# Patient Record
Sex: Female | Born: 1965 | Race: White | Hispanic: No | Marital: Married | Smoking: Never smoker
Health system: Southern US, Community
[De-identification: ages and names within clinical notes are randomized; demographics above are authoritative.]

## PROBLEM LIST (undated history)

## (undated) DIAGNOSIS — I10 Essential (primary) hypertension: Secondary | ICD-10-CM

## (undated) DIAGNOSIS — Z5189 Encounter for other specified aftercare: Secondary | ICD-10-CM

## (undated) DIAGNOSIS — R51 Headache: Secondary | ICD-10-CM

## (undated) DIAGNOSIS — K219 Gastro-esophageal reflux disease without esophagitis: Secondary | ICD-10-CM

## (undated) HISTORY — PX: OTHER SURGICAL HISTORY: SHX169

## (undated) HISTORY — PX: WISDOM TOOTH EXTRACTION: SHX21

## (undated) HISTORY — DX: Essential (primary) hypertension: I10

## (undated) HISTORY — PX: HERNIA REPAIR: SHX51

---

## 1989-05-30 DIAGNOSIS — IMO0001 Reserved for inherently not codable concepts without codable children: Secondary | ICD-10-CM

## 1989-05-30 DIAGNOSIS — Z5189 Encounter for other specified aftercare: Secondary | ICD-10-CM

## 1989-05-30 HISTORY — DX: Reserved for inherently not codable concepts without codable children: IMO0001

## 1989-05-30 HISTORY — DX: Encounter for other specified aftercare: Z51.89

## 1989-09-29 HISTORY — PX: CHOLECYSTECTOMY: SHX55

## 1992-09-29 HISTORY — PX: TUBAL LIGATION: SHX77

## 1997-09-29 HISTORY — PX: ABDOMINAL HYSTERECTOMY: SHX81

## 2003-03-13 ENCOUNTER — Encounter: Payer: Self-pay | Admitting: Emergency Medicine

## 2003-03-13 ENCOUNTER — Emergency Department (HOSPITAL_COMMUNITY): Admission: EM | Admit: 2003-03-13 | Discharge: 2003-03-13 | Payer: Self-pay | Admitting: Emergency Medicine

## 2003-03-16 ENCOUNTER — Encounter: Admission: RE | Admit: 2003-03-16 | Discharge: 2003-03-16 | Payer: Self-pay | Admitting: Family Medicine

## 2003-03-16 ENCOUNTER — Encounter: Payer: Self-pay | Admitting: Family Medicine

## 2006-12-05 ENCOUNTER — Emergency Department (HOSPITAL_COMMUNITY): Admission: EM | Admit: 2006-12-05 | Discharge: 2006-12-05 | Payer: Self-pay | Admitting: Emergency Medicine

## 2006-12-05 IMAGING — CR DG CERVICAL SPINE COMPLETE 4+V
4 series · 4 of 4 positions shown · non-contrast
Comparison: none

CLINICAL DATA: Fall from horse, neck pain.  
 CERVICAL SPINE ? 6 VIEW:

[w c-spine lat]
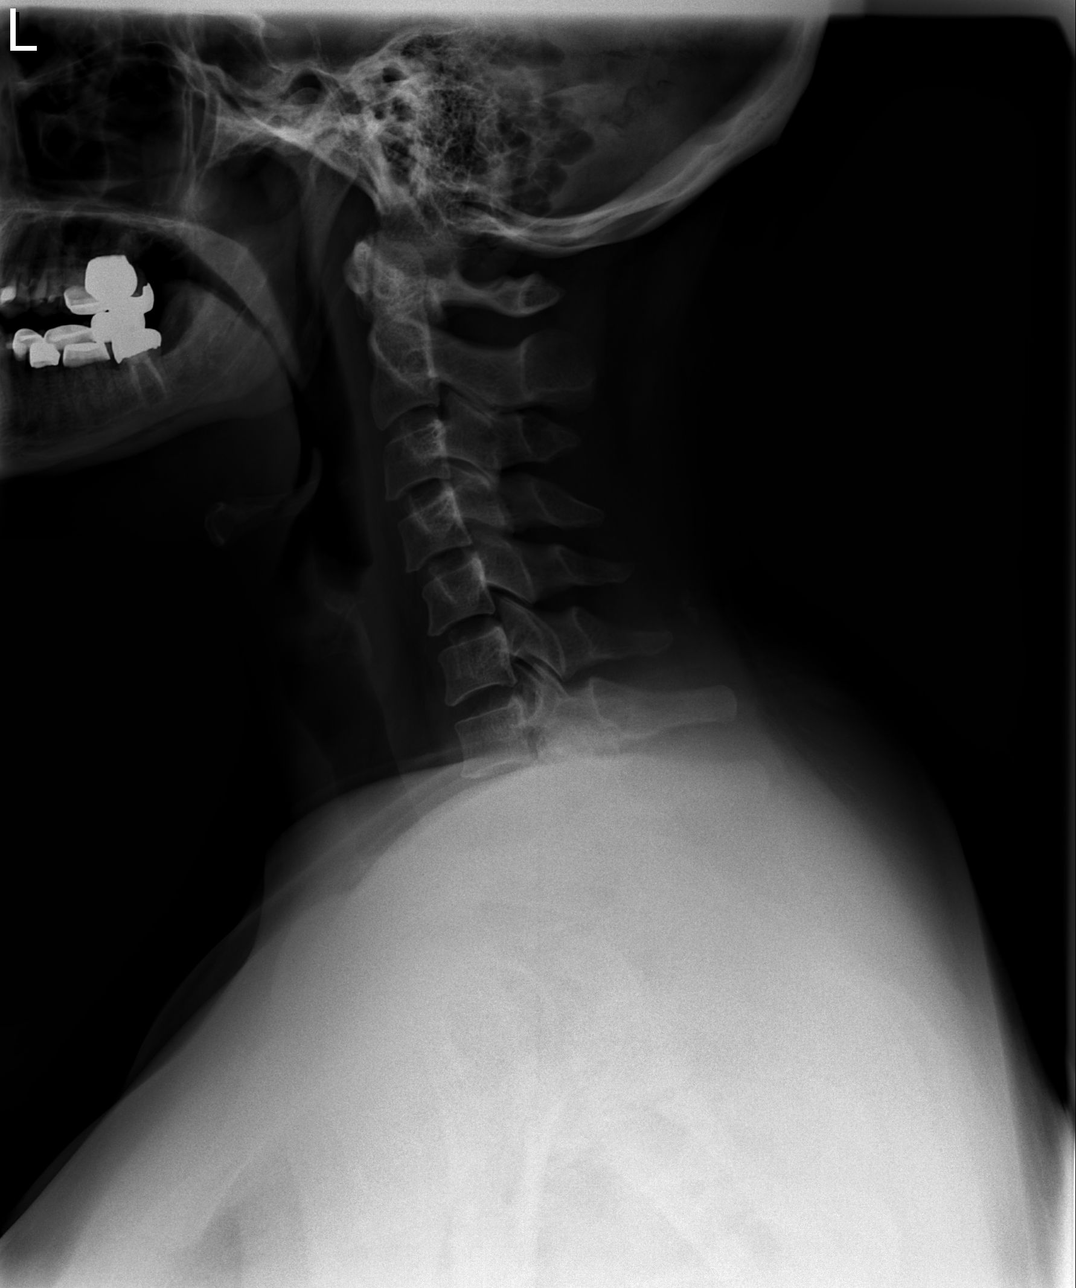

[w c-spine oblique (1 of 2)]
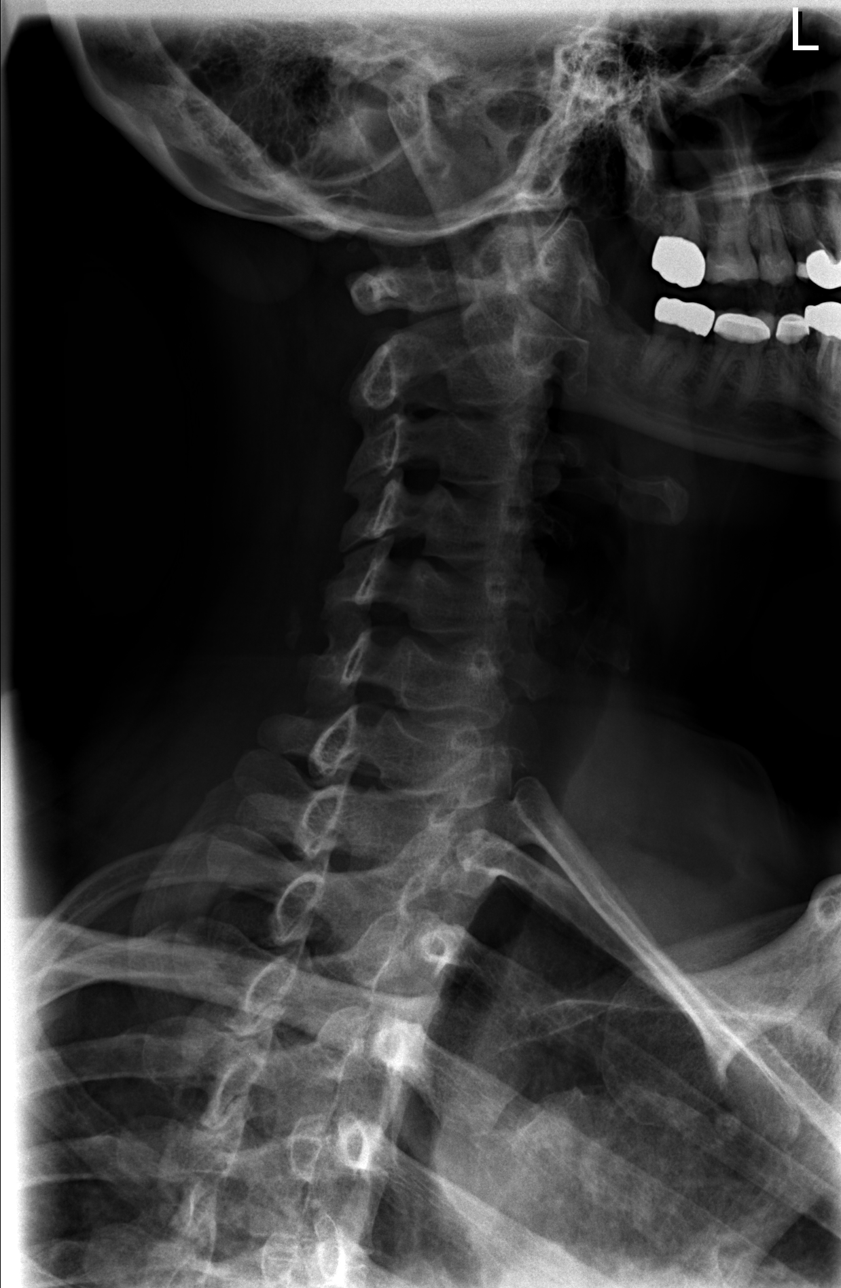

[w c-spine oblique (2 of 2)]
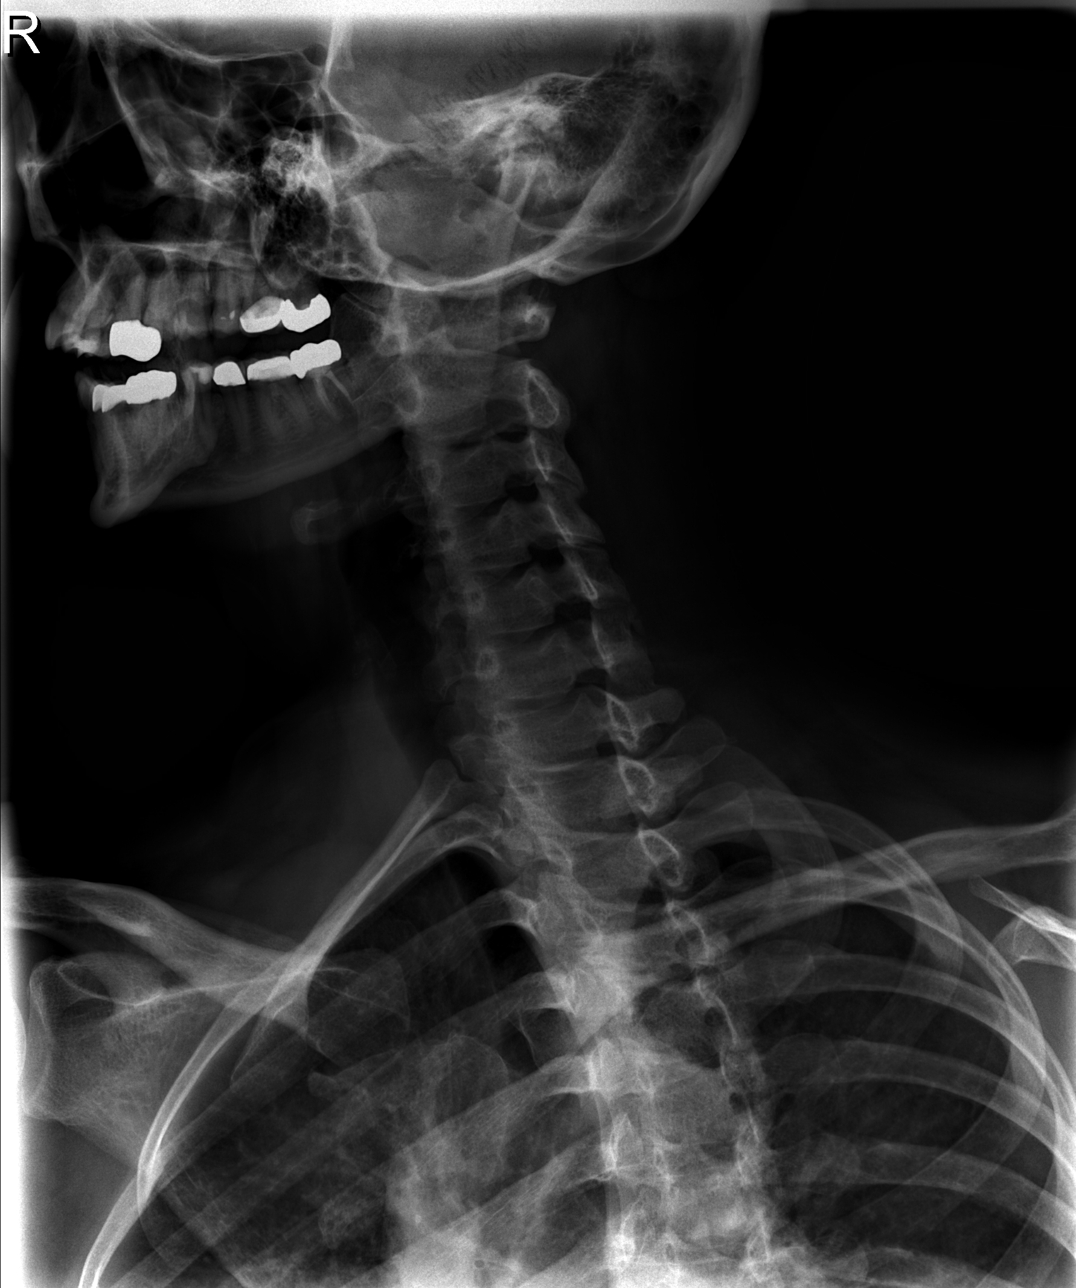

[w c-spine a.p.]
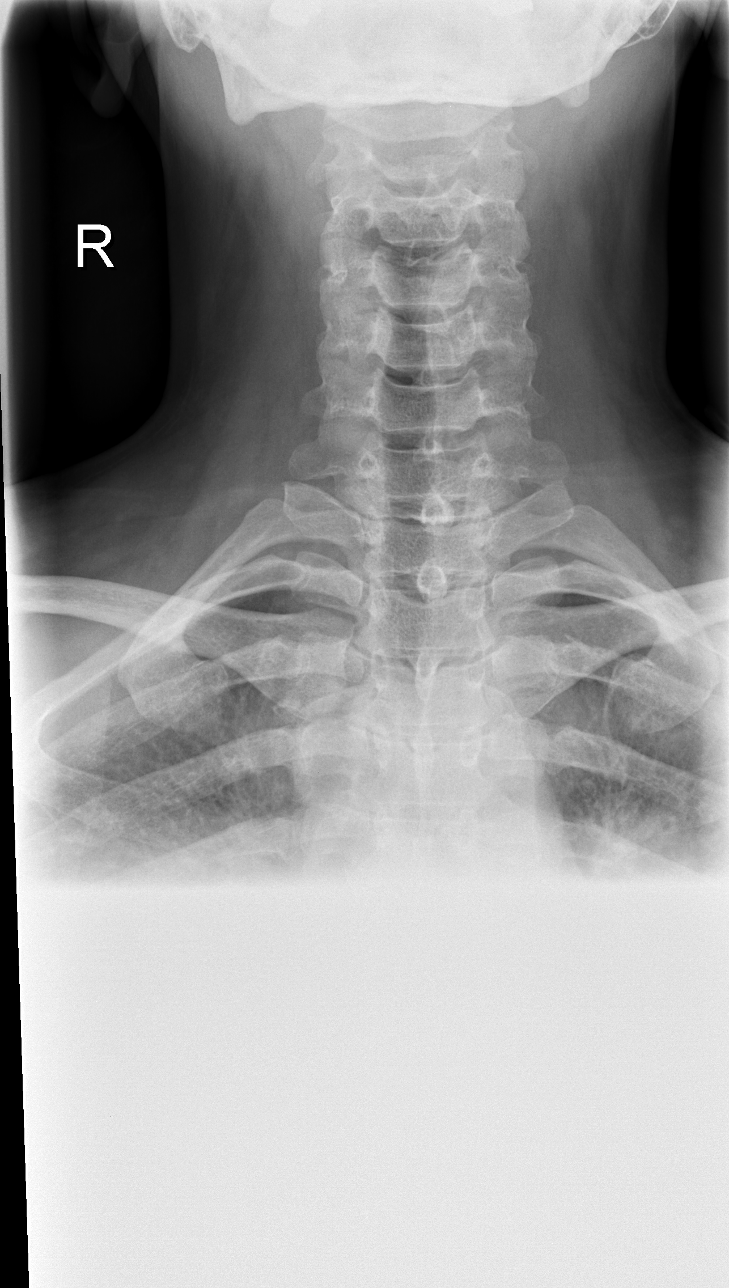

[4 of 4 positions shown; findings below may reference images not displayed]

FINDINGS: C1 through the cervicothoracic junction is visualized.  There is reversal of the normal cervical lordosis at C4-C5.  No precervical soft tissue widening.  Neural foramina are patent.  Calcifications/ossification is noted over the posterior soft tissues between the C5 and C6 spinous processes but no donor site is seen to suggest avulsion fracture and the spinous processes are intact.  The odontoid is unremarkable.
IMPRESSION: Reversal of the normal cervical lordosis may be positional.  No acute fracture or dislocation.

## 2010-09-29 HISTORY — PX: OTHER SURGICAL HISTORY: SHX169

## 2011-08-18 ENCOUNTER — Other Ambulatory Visit: Payer: Self-pay | Admitting: Family Medicine

## 2011-08-18 DIAGNOSIS — R1031 Right lower quadrant pain: Secondary | ICD-10-CM

## 2011-08-20 ENCOUNTER — Ambulatory Visit
Admission: RE | Admit: 2011-08-20 | Discharge: 2011-08-20 | Disposition: A | Discharge status: Home / Self Care | Payer: BC Managed Care – HMO | Source: Ambulatory Visit | Attending: Family Medicine | Admitting: Family Medicine

## 2011-08-20 DIAGNOSIS — R1031 Right lower quadrant pain: Secondary | ICD-10-CM

## 2011-08-20 MED ORDER — IOHEXOL 300 MG/ML  SOLN
100.0000 mL | Freq: Once | INTRAMUSCULAR | Status: AC | PRN
  Administered 2011-08-20: 100 mL via INTRAVENOUS

## 2011-11-10 ENCOUNTER — Encounter (HOSPITAL_COMMUNITY): Payer: Self-pay

## 2011-11-17 ENCOUNTER — Encounter (HOSPITAL_COMMUNITY): Payer: Self-pay

## 2011-11-17 ENCOUNTER — Encounter (HOSPITAL_COMMUNITY)
Admission: RE | Admit: 2011-11-17 | Discharge: 2011-11-17 | Disposition: A | Discharge status: Home / Self Care | Payer: BC Managed Care – PPO | Source: Ambulatory Visit | Attending: Obstetrics and Gynecology | Admitting: Obstetrics and Gynecology

## 2011-11-17 HISTORY — DX: Gastro-esophageal reflux disease without esophagitis: K21.9

## 2011-11-17 HISTORY — DX: Encounter for other specified aftercare: Z51.89

## 2011-11-17 HISTORY — DX: Headache: R51

## 2011-11-17 LAB — CBC
HCT: 42.1 % (ref 36.0–46.0)
MCV: 82.5 fL (ref 78.0–100.0)
Platelets: 298 10*3/uL (ref 150–400)
RBC: 5.1 MIL/uL (ref 3.87–5.11)
RDW: 14.3 % (ref 11.5–15.5)
WBC: 8.3 10*3/uL (ref 4.0–10.5)

## 2011-11-17 LAB — SURGICAL PCR SCREEN: Staphylococcus aureus: POSITIVE — AB

## 2011-11-17 NOTE — Pre-Procedure Instructions (Signed)
Ok to see patient DOS. 

## 2011-11-17 NOTE — Patient Instructions (Addendum)
   Your procedure is scheduled on: Tuesday, Feb 26th  Enter through the Main Entrance of [facility] at: [employer] up the phone at the desk and dial [phone] and inform us of your arrival.  Please call this number if you have any problems the morning of surgery: [phone]  Remember: Do not eat food after midnight: Monday Do not drink clear liquids after: Monday Take these medicines the morning of surgery with a SIP OF WATER: DEXILANT  Do not wear jewelry, make-up, or FINGER nail polish Do not wear lotions, powders, perfumes or deodorant. Do not shave 48 hours prior to surgery. Do not bring valuables to the hospital.  Leave suitcase in the car. After Surgery it may be brought to your room. For patients being admitted to the hospital, checkout time is 11:00am the day of discharge.  Home with Husband Tammy Bowman  cell [phone]  Patients discharged on the day of surgery will not be allowed to drive home.     Remember to use your hibiclens as instructed.Please shower with 1/2 bottle the evening before your surgery and the other 1/2 bottle the morning of surgery.

## 2011-11-24 MED ORDER — CEFAZOLIN SODIUM-DEXTROSE 2-3 GM-% IV SOLR
2.0000 g | INTRAVENOUS | Status: AC
  Administered 2011-11-25: 2 g via INTRAVENOUS
  Filled 2011-11-24: qty 50

## 2011-11-24 NOTE — H&P (Signed)
46 year old G 3 P3 who has had a hysterectomy presents for Laparotomy, possible BSO. She has had an abdominoplasty and has known mesh placed at her umbilicus. She is having chronic pelvic pain. Progesterone did not help her pain. Ultrasound on 10/27/2011 showed 2 right ovarian simples cysts and multiple calcifications. Possible endometriosis. Hydrosalpinx on the right. Left ovary showed a cyst 2.8 x 2.4 x 2.2 cm with two smaller cysts.  Medical History : Headaches  IBS UTI  Surgical history: Above Lap Chole CSection Hysterectomy 1999 Abdominoplasty 2007  Meds. None  Allergies Latex swellinig  Sox history  No alcohol  No tobacco Married  Family History is negative  ROS is non contributory  Afebrile Vital signs stable General alert and oriented Lung CTAB Car RRR Abdomen multiple surgical scars  Mesh palpable at umbilicus Pelvic  Cervix is absent  Impression: Persistent ovarian cysts Possible adhesions, endometriosis  PLAN: Laparotomy Possible lysis of adhesions BSO Risks reviewed with the patient

## 2011-11-25 ENCOUNTER — Encounter (HOSPITAL_COMMUNITY): Payer: Self-pay | Admitting: Anesthesiology

## 2011-11-25 ENCOUNTER — Inpatient Hospital Stay (HOSPITAL_COMMUNITY): Payer: BC Managed Care – PPO | Admitting: Anesthesiology

## 2011-11-25 ENCOUNTER — Encounter (HOSPITAL_COMMUNITY): Payer: Self-pay | Admitting: *Deleted

## 2011-11-25 ENCOUNTER — Inpatient Hospital Stay (HOSPITAL_COMMUNITY)
Admission: RE | Admit: 2011-11-25 | Discharge: 2011-11-27 | DRG: 359 | Disposition: A | Discharge status: Home / Self Care | Payer: BC Managed Care – PPO | Source: Ambulatory Visit | Attending: Obstetrics and Gynecology | Admitting: Obstetrics and Gynecology

## 2011-11-25 ENCOUNTER — Encounter (HOSPITAL_COMMUNITY)
Admission: RE | Disposition: A | Discharge status: Home / Self Care | Payer: Self-pay | Source: Ambulatory Visit | Attending: Obstetrics and Gynecology

## 2011-11-25 DIAGNOSIS — Z01812 Encounter for preprocedural laboratory examination: Secondary | ICD-10-CM

## 2011-11-25 DIAGNOSIS — N949 Unspecified condition associated with female genital organs and menstrual cycle: Secondary | ICD-10-CM | POA: Diagnosis present

## 2011-11-25 DIAGNOSIS — Z01818 Encounter for other preprocedural examination: Secondary | ICD-10-CM

## 2011-11-25 DIAGNOSIS — N83209 Unspecified ovarian cyst, unspecified side: Principal | ICD-10-CM | POA: Diagnosis present

## 2011-11-25 DIAGNOSIS — Z90710 Acquired absence of both cervix and uterus: Secondary | ICD-10-CM

## 2011-11-25 DIAGNOSIS — Z90722 Acquired absence of ovaries, bilateral: Secondary | ICD-10-CM

## 2011-11-25 HISTORY — PX: SALPINGOOPHORECTOMY: SHX82

## 2011-11-25 HISTORY — PX: LAPAROTOMY: SHX154

## 2011-11-25 SURGERY — LAPAROTOMY, EXPLORATORY
Anesthesia: General | Site: Abdomen | Wound class: Clean Contaminated

## 2011-11-25 MED ORDER — KETOROLAC TROMETHAMINE 30 MG/ML IJ SOLN: 30.0000 mg | Freq: Once | INTRAMUSCULAR | Status: DC

## 2011-11-25 MED ORDER — ONDANSETRON HCL 4 MG/2ML IJ SOLN
INTRAMUSCULAR | Status: AC
  Filled 2011-11-25: qty 2

## 2011-11-25 MED ORDER — PROPOFOL 10 MG/ML IV EMUL
INTRAVENOUS | Status: DC | PRN
  Administered 2011-11-25: 200 mg via INTRAVENOUS

## 2011-11-25 MED ORDER — NEOSTIGMINE METHYLSULFATE 1 MG/ML IJ SOLN
INTRAMUSCULAR | Status: AC
  Filled 2011-11-25: qty 10

## 2011-11-25 MED ORDER — DIPHENHYDRAMINE HCL 12.5 MG/5ML PO ELIX
12.5000 mg | ORAL_SOLUTION | Freq: Four times a day (QID) | ORAL | Status: DC | PRN
  Filled 2011-11-25: qty 5

## 2011-11-25 MED ORDER — PROMETHAZINE HCL 25 MG/ML IJ SOLN: 12.5000 mg | INTRAMUSCULAR | Status: DC | PRN

## 2011-11-25 MED ORDER — NEOSTIGMINE METHYLSULFATE 1 MG/ML IJ SOLN
INTRAMUSCULAR | Status: DC | PRN
  Administered 2011-11-25: 2 mg via INTRAVENOUS

## 2011-11-25 MED ORDER — BUPIVACAINE-EPINEPHRINE PF 0.25-1:200000 % IJ SOLN
INTRAMUSCULAR | Status: AC
  Filled 2011-11-25: qty 30

## 2011-11-25 MED ORDER — FENTANYL CITRATE 0.05 MG/ML IJ SOLN
INTRAMUSCULAR | Status: AC
  Filled 2011-11-25: qty 5

## 2011-11-25 MED ORDER — KETOROLAC TROMETHAMINE 30 MG/ML IJ SOLN: 15.0000 mg | Freq: Once | INTRAMUSCULAR | Status: DC | PRN

## 2011-11-25 MED ORDER — ONDANSETRON HCL 4 MG/2ML IJ SOLN: 4.0000 mg | Freq: Four times a day (QID) | INTRAMUSCULAR | Status: DC | PRN

## 2011-11-25 MED ORDER — TEMAZEPAM 15 MG PO CAPS: 15.0000 mg | ORAL_CAPSULE | Freq: Every evening | ORAL | Status: DC | PRN

## 2011-11-25 MED ORDER — PROPOFOL 10 MG/ML IV EMUL
INTRAVENOUS | Status: AC
  Filled 2011-11-25: qty 20

## 2011-11-25 MED ORDER — HYDROMORPHONE HCL PF 1 MG/ML IJ SOLN
0.2500 mg | INTRAMUSCULAR | Status: DC | PRN
  Administered 2011-11-25 (×4): 0.5 mg via INTRAVENOUS

## 2011-11-25 MED ORDER — HYDROMORPHONE 0.3 MG/ML IV SOLN
INTRAVENOUS | Status: DC
  Administered 2011-11-25: 10:00:00 via INTRAVENOUS
  Administered 2011-11-25: 1.79 mg via INTRAVENOUS
  Administered 2011-11-25: 0.2 mg via INTRAVENOUS
  Administered 2011-11-25: 10 mg via INTRAVENOUS
  Administered 2011-11-26: 0.6 mg via INTRAVENOUS
  Administered 2011-11-26: 1.19 mg via INTRAVENOUS

## 2011-11-25 MED ORDER — NALOXONE HCL 0.4 MG/ML IJ SOLN: 0.4000 mg | INTRAMUSCULAR | Status: DC | PRN

## 2011-11-25 MED ORDER — DIPHENHYDRAMINE HCL 50 MG/ML IJ SOLN
12.5000 mg | Freq: Four times a day (QID) | INTRAMUSCULAR | Status: DC | PRN
Start: 2011-11-25 — End: 2011-11-27

## 2011-11-25 MED ORDER — GLYCOPYRROLATE 0.2 MG/ML IJ SOLN
INTRAMUSCULAR | Status: DC | PRN
  Administered 2011-11-25: .4 mg via INTRAVENOUS

## 2011-11-25 MED ORDER — HYDROMORPHONE 0.3 MG/ML IV SOLN
INTRAVENOUS | Status: AC
  Filled 2011-11-25: qty 25

## 2011-11-25 MED ORDER — MIDAZOLAM HCL 2 MG/2ML IJ SOLN
INTRAMUSCULAR | Status: AC
  Filled 2011-11-25: qty 2

## 2011-11-25 MED ORDER — KETOROLAC TROMETHAMINE 30 MG/ML IJ SOLN
INTRAMUSCULAR | Status: AC
  Filled 2011-11-25: qty 1

## 2011-11-25 MED ORDER — TRAMADOL HCL 50 MG PO TABS: 50.0000 mg | ORAL_TABLET | Freq: Four times a day (QID) | ORAL | Status: DC | PRN

## 2011-11-25 MED ORDER — DEXAMETHASONE SODIUM PHOSPHATE 10 MG/ML IJ SOLN
INTRAMUSCULAR | Status: AC
  Filled 2011-11-25: qty 1

## 2011-11-25 MED ORDER — ROCURONIUM BROMIDE 50 MG/5ML IV SOLN
INTRAVENOUS | Status: AC
  Filled 2011-11-25: qty 1

## 2011-11-25 MED ORDER — ROCURONIUM BROMIDE 100 MG/10ML IV SOLN
INTRAVENOUS | Status: DC | PRN
  Administered 2011-11-25: 40 mg via INTRAVENOUS

## 2011-11-25 MED ORDER — LIDOCAINE HCL (CARDIAC) 20 MG/ML IV SOLN
INTRAVENOUS | Status: AC
  Filled 2011-11-25: qty 5

## 2011-11-25 MED ORDER — DEXAMETHASONE SODIUM PHOSPHATE 4 MG/ML IJ SOLN
INTRAMUSCULAR | Status: DC | PRN
  Administered 2011-11-25: 10 mg via INTRAVENOUS

## 2011-11-25 MED ORDER — CEFAZOLIN SODIUM 1-5 GM-% IV SOLN
INTRAVENOUS | Status: AC
  Filled 2011-11-25: qty 50

## 2011-11-25 MED ORDER — HYDROMORPHONE HCL PF 1 MG/ML IJ SOLN
INTRAMUSCULAR | Status: AC
  Administered 2011-11-25: 0.5 mg via INTRAVENOUS
  Filled 2011-11-25: qty 1

## 2011-11-25 MED ORDER — ONDANSETRON HCL 4 MG/2ML IJ SOLN
INTRAMUSCULAR | Status: DC | PRN
  Administered 2011-11-25: 4 mg via INTRAVENOUS

## 2011-11-25 MED ORDER — IBUPROFEN 600 MG PO TABS: 600.0000 mg | ORAL_TABLET | Freq: Four times a day (QID) | ORAL | Status: DC | PRN

## 2011-11-25 MED ORDER — MENTHOL 3 MG MT LOZG: 1.0000 | LOZENGE | OROMUCOSAL | Status: DC | PRN

## 2011-11-25 MED ORDER — MIDAZOLAM HCL 5 MG/5ML IJ SOLN
INTRAMUSCULAR | Status: DC | PRN
  Administered 2011-11-25: 2 mg via INTRAVENOUS

## 2011-11-25 MED ORDER — FENTANYL CITRATE 0.05 MG/ML IJ SOLN
INTRAMUSCULAR | Status: DC | PRN
  Administered 2011-11-25: 150 ug via INTRAVENOUS
  Administered 2011-11-25 (×2): 50 ug via INTRAVENOUS

## 2011-11-25 MED ORDER — GLYCOPYRROLATE 0.2 MG/ML IJ SOLN
INTRAMUSCULAR | Status: AC
  Filled 2011-11-25: qty 2

## 2011-11-25 MED ORDER — LACTATED RINGERS IV SOLN
INTRAVENOUS | Status: DC
  Administered 2011-11-25: 08:00:00 via INTRAVENOUS
  Administered 2011-11-25: 125 mL/h via INTRAVENOUS

## 2011-11-25 MED ORDER — BUPIVACAINE HCL (PF) 0.25 % IJ SOLN
INTRAMUSCULAR | Status: DC | PRN
  Administered 2011-11-25: 10 mL

## 2011-11-25 MED ORDER — KETOROLAC TROMETHAMINE 30 MG/ML IJ SOLN
INTRAMUSCULAR | Status: DC | PRN
  Administered 2011-11-25: 30 mg via INTRAVENOUS

## 2011-11-25 MED ORDER — LIDOCAINE HCL (CARDIAC) 20 MG/ML IV SOLN
INTRAVENOUS | Status: DC | PRN
  Administered 2011-11-25: 50 mg via INTRAVENOUS

## 2011-11-25 MED ORDER — SODIUM CHLORIDE 0.9 % IJ SOLN: 9.0000 mL | INTRAMUSCULAR | Status: DC | PRN

## 2011-11-25 SURGICAL SUPPLY — 32 items
BLADE SURG CLIPPER 3M 9600 (MISCELLANEOUS) ×3 IMPLANT
CANISTER SUCTION 2500CC (MISCELLANEOUS) ×3 IMPLANT
CHLORAPREP W/TINT 26ML (MISCELLANEOUS) ×3 IMPLANT
CLOTH BEACON ORANGE TIMEOUT ST (SAFETY) ×3 IMPLANT
COVER LIGHT HANDLE  1/PK (MISCELLANEOUS) ×3
COVER LIGHT HANDLE 1/PK (MISCELLANEOUS) ×6 IMPLANT
DECANTER SPIKE VIAL GLASS SM (MISCELLANEOUS) IMPLANT
DERMABOND ADVANCED (GAUZE/BANDAGES/DRESSINGS) ×1
DERMABOND ADVANCED .7 DNX12 (GAUZE/BANDAGES/DRESSINGS) ×2 IMPLANT
GAUZE SPONGE 4X4 16PLY XRAY LF (GAUZE/BANDAGES/DRESSINGS) ×3 IMPLANT
GLOVE BIO SURGEON STRL SZ 6.5 (GLOVE) ×6 IMPLANT
GOWN PREVENTION PLUS LG XLONG (DISPOSABLE) ×9 IMPLANT
NEEDLE HYPO 22GX1.5 SAFETY (NEEDLE) ×3 IMPLANT
NEEDLE HYPO 25X1 1.5 SAFETY (NEEDLE) IMPLANT
NS IRRIG 1000ML POUR BTL (IV SOLUTION) ×3 IMPLANT
PACK ABDOMINAL GYN (CUSTOM PROCEDURE TRAY) ×3 IMPLANT
PAD OB MATERNITY 4.3X12.25 (PERSONAL CARE ITEMS) ×3 IMPLANT
SPONGE LAP 18X18 X RAY DECT (DISPOSABLE) IMPLANT
STAPLER VISISTAT 35W (STAPLE) IMPLANT
SUT PDS AB 0 CTX 60 (SUTURE) IMPLANT
SUT PLAIN 2 0 XLH (SUTURE) IMPLANT
SUT VIC AB 0 CT1 18XCR BRD8 (SUTURE) ×4 IMPLANT
SUT VIC AB 0 CT1 27 (SUTURE) ×3
SUT VIC AB 0 CT1 27XBRD ANBCTR (SUTURE) ×6 IMPLANT
SUT VIC AB 0 CT1 8-18 (SUTURE) ×2
SUT VIC AB 4-0 KS 27 (SUTURE) ×3 IMPLANT
SUT VICRYL 0 TIES 12 18 (SUTURE) ×3 IMPLANT
SYR CONTROL 10ML LL (SYRINGE) ×3 IMPLANT
SYRINGE 10CC LL (SYRINGE) ×3 IMPLANT
TOWEL OR 17X24 6PK STRL BLUE (TOWEL DISPOSABLE) ×6 IMPLANT
TRAY FOLEY CATH 14FR (SET/KITS/TRAYS/PACK) IMPLANT
WATER STERILE IRR 1000ML POUR (IV SOLUTION) ×3 IMPLANT

## 2011-11-25 NOTE — Anesthesia Procedure Notes (Signed)
Procedure Name: Intubation Date/Time: 11/25/2011 7:26 AM Performed by: Madison Hickman Pre-anesthesia Checklist: Patient identified, Emergency Drugs available, Suction available, Patient being monitored and Timeout performed Patient Re-evaluated:Patient Re-evaluated prior to inductionOxygen Delivery Method: Circle system utilized Preoxygenation: Pre-oxygenation with 100% oxygen Intubation Type: IV induction and Inhalational induction Ventilation: Mask ventilation without difficulty Laryngoscope Size: Mac and 3 Grade View: Grade III Tube type: Oral Number of attempts: 2 Airway Equipment and Method: Stylet Placement Confirmation: ETT inserted through vocal cords under direct vision,  positive ETCO2 and breath sounds checked- equal and bilateral Secured at: 21 cm Tube secured with: Tape Dental Injury: Teeth and Oropharynx as per pre-operative assessment  Difficulty Due To: Difficult Airway- due to anterior larynx and Difficult Airway- due to limited oral opening Comments: Grade 3 view with Mac 3, DL x 1 anterior airway. DL x 1 with glidescope, cords visible. Easy atraumatic intubation with glidescope.

## 2011-11-25 NOTE — Brief Op Note (Signed)
11/25/2011  8:18 AM  PATIENT:  Tammy Bowman  46 y.o. female  PRE-OPERATIVE DIAGNOSIS:  ovarian cysts, possible endometriosis  POST-OPERATIVE DIAGNOSIS:  ovarian cysts; pelvic adhesions  PROCEDURE:  Procedure(s) (LRB): EXPLORATORY LAPAROTOMY (N/A) SALPINGO OOPHERECTOMY (Bilateral) LOA  SURGEON:  Surgeon(s) and Role:    * Jeani Hawking, MD - Primary    * Turner Daniels, MD - Assisting  PHYSICIAN ASSISTANT:   ASSISTANTS: none   ANESTHESIA:   general  EBL:  Total I/O In: 1000 [I.V.:1000] Out: 225 [Urine:200; Blood:25]  BLOOD ADMINISTERED:none  DRAINS: Urinary Catheter (Foley)   LOCAL MEDICATIONS USED:  XYLOCAINE   SPECIMEN:  Source of Specimen:  ovaries and tubes  DISPOSITION OF SPECIMEN:  PATHOLOGY  COUNTS:  YES  TOURNIQUET:  * No tourniquets in log *  DICTATION: .Other Dictation: Dictation Number [id number]  PLAN OF CARE: Admit to inpatient   PATIENT DISPOSITION:  PACU - hemodynamically stable.   Delay start of Pharmacological VTE agent (>24hrs) due to surgical blood loss or risk of bleeding: yes

## 2011-11-25 NOTE — Anesthesia Preprocedure Evaluation (Signed)
Anesthesia Evaluation  Patient identified by MRN, date of birth, ID band Patient awake    Reviewed: Allergy & Precautions, H&P , NPO status , Patient's Chart, lab work & pertinent test results, reviewed documented beta blocker date and time   History of Anesthesia Complications Negative for: history of anesthetic complications  Airway Mallampati: II TM Distance: >3 FB Neck ROM: full    Dental  (+) Teeth Intact   Pulmonary neg pulmonary ROS,  clear to auscultation  Pulmonary exam normal       Cardiovascular Exercise Tolerance: Good neg cardio ROS regular Normal    Neuro/Psych Negative Neurological ROS  Negative Psych ROS   GI/Hepatic negative GI ROS, Neg liver ROS, GERD- (took dexilant today)  Medicated,  Endo/Other  Negative Endocrine ROS  Renal/GU negative Renal ROS  Genitourinary negative   Musculoskeletal   Abdominal   Peds  Hematology negative hematology ROS (+)   Anesthesia Other Findings   Reproductive/Obstetrics negative OB ROS                           Anesthesia Physical Anesthesia Plan  ASA: II  Anesthesia Plan: General ETT   Post-op Pain Management:    Induction:   Airway Management Planned:   Additional Equipment:   Intra-op Plan:   Post-operative Plan:   Informed Consent: I have reviewed the patients History and Physical, chart, labs and discussed the procedure including the risks, benefits and alternatives for the proposed anesthesia with the patient or authorized representative who has indicated his/her understanding and acceptance.   Dental Advisory Given  Plan Discussed with: CRNA and Surgeon  Anesthesia Plan Comments:         Anesthesia Quick Evaluation

## 2011-11-25 NOTE — Addendum Note (Signed)
Addendum  created 11/25/11 1657 by Fanny Dance, CRNA   Modules edited:Notes Section

## 2011-11-25 NOTE — Anesthesia Postprocedure Evaluation (Signed)
  Anesthesia Post-op Note  Patient: Tammy Bowman  Procedure(s) Performed: Procedure(s) (LRB): EXPLORATORY LAPAROTOMY (N/A) SALPINGO OOPHERECTOMY (Bilateral) Patient is awake and responsive. Pain and nausea are reasonably well controlled. Vital signs are stable and clinically acceptable. Oxygen saturation is clinically acceptable. There are no apparent anesthetic complications at this time. Patient is ready for discharge.

## 2011-11-25 NOTE — Progress Notes (Signed)
History and physical on the chart. No significant abnormalities Will proceed with laparotomy and bso.

## 2011-11-25 NOTE — Transfer of Care (Signed)
Immediate Anesthesia Transfer of Care Note  Patient: Tammy Bowman  Procedure(s) Performed: Procedure(s) (LRB): EXPLORATORY LAPAROTOMY (N/A) SALPINGO OOPHERECTOMY (Bilateral)  Patient Location: PACU  Anesthesia Type: General  Level of Consciousness: awake, alert  and oriented  Airway & Oxygen Therapy: Patient Spontanous Breathing and Patient connected to nasal cannula oxygen  Post-op Assessment: Report given to PACU RN and Post -op Vital signs reviewed and stable  Post vital signs: Reviewed and stable  Complications: No apparent anesthesia complications

## 2011-11-25 NOTE — Op Note (Signed)
NAMEMarland Bowman  LARENE, ASCENCIO         ACCOUNT NO.:  [id number]  MEDICAL RECORD NO.:  [id number]  LOCATION:  9304                          FACILITY:  [facility]  PHYSICIAN:  Aiko L. Marlenne Ridge, M.D.DATE OF BIRTH:  Feb 17, 1966  DATE OF PROCEDURE:  11/25/2011 DATE OF DISCHARGE:                              OPERATIVE REPORT   PREOPERATIVE DIAGNOSES:  Pelvic pain and ovarian cyst.  POSTOPERATIVE DIAGNOSES:  Pelvic pain, ovarian cyst and pelvic adhesions.  SURGEON:  Lakeesha L. Vincente Poli, MD  ASSISTANT:  Dineen Kid. Rana Snare, MD  ANESTHESIA:  General.  EBL:  Minimal.  PROCEDURE:  The patient was taken to the operating room.  She was intubated.  She was then prepped and draped in usual sterile fashion.  A Foley catheter was inserted.  A low-transverse incision was made at the area of the previous abdominoplasty.  We did use a portion of that scar. I noticed an abundant amount of permanent suture, which we removed and cut out as we went through the fascia.  Fascia was scored at the midline and extended laterally.  Rectus muscles were separated in the midline. Peritoneum was entered bluntly.  Peritoneal incision was stretched.  She did have some dense omental adhesions, which we took down using the Bovie with excellent hemostasis and we were then used the [employer] retractor and placed in the abdominal cavity and placed the large and small bowel in the upper abdomen.  I was then able to easily visualize her left ovary, it was stuck down some, we lifted it with the Babcock clamp.  We saw that it was stuck to the round ligament.  We then clamped the round ligament with a Heaney clamp, suture ligated after dividing it and we were then identified the IP ligament.  The ureter was deep in the pelvis.  We placed a clamp across the IP ligament and removing the tube and ovary, and the specimen was identified as left tube and ovary.  The pedicle was secured using the suture ligature and a free tie of 0  Vicryl suture.  At this point, we then went over to the right ovary, this ovary was stuck down much more and we were able to elevate any sharp and blunt dissection to free it in likewise fashion. It was stuck to the round ligament and we clamped the round ligament using curved Heaney clamps, suture ligated it and then identified the IP ligament.  The ureter was deep in the pelvis.  We placed curved Heaney clamps across the IP ligament and identified the specimen of right tube and ovary.  We removed the specimen and sent to pathology.  The pedicle was secured with both a free tie of 0 Vicryl suture and suture ligature of 0 Vicryl suture.  Hemostasis was noted.  Irrigation was performed. Hemostasis was again noted, and we finished the procedure.  All adhesions were freed from the pelvis.  After removing all instruments from the abdominal cavity, we closed the peritoneum using 0 Vicryl and reapproximated the rectus muscles with 0 Vicryl.  We then closed the fascia using 0 Vicryl starting each corner meeting in the midline x2.  After irrigation of subcutaneous layer, the skin was closed with a 4-0  Vicryl on a subcuticular and Dermabond was applied.  All sponge, lap, and instrument counts were correct x2.  The patient went to recovery room in stable condition.     Kalesha L. Vincente Bowman, M.D.     Florestine Avers  D:  11/25/2011  T:  11/25/2011  Job:  [id number]

## 2011-11-25 NOTE — Anesthesia Postprocedure Evaluation (Signed)
  Anesthesia Post-op Note  Patient: Tammy Bowman  Procedure(s) Performed: Procedure(s) (LRB): EXPLORATORY LAPAROTOMY (N/A) SALPINGO OOPHERECTOMY (Bilateral)  Patient Location: [facility]  Anesthesia Type: General  Level of Consciousness: alert  and oriented  Airway and Oxygen Therapy: Patient Spontanous Breathing  Post-op Pain: mild  Post-op Assessment: Patient's Cardiovascular Status Stable and Respiratory Function Stable  Post-op Vital Signs: stable  Complications: No apparent anesthesia complications

## 2011-11-26 ENCOUNTER — Encounter (HOSPITAL_COMMUNITY): Payer: Self-pay | Admitting: Obstetrics and Gynecology

## 2011-11-26 ENCOUNTER — Other Ambulatory Visit (HOSPITAL_COMMUNITY): Payer: BC Managed Care – HMO

## 2011-11-26 LAB — CBC
HCT: 38 % (ref 36.0–46.0)
Hemoglobin: 12.6 g/dL (ref 12.0–15.0)
MCH: 28.3 pg (ref 26.0–34.0)
MCHC: 33.2 g/dL (ref 30.0–36.0)
MCV: 85.4 fL (ref 78.0–100.0)
Platelets: 293 K/uL (ref 150–400)
RBC: 4.45 MIL/uL (ref 3.87–5.11)
RDW: 13.7 % (ref 11.5–15.5)
WBC: 13.8 K/uL — ABNORMAL HIGH (ref 4.0–10.5)

## 2011-11-26 LAB — BASIC METABOLIC PANEL
BUN: 9 mg/dL (ref 6–23)
CO2: 28 mEq/L (ref 19–32)
GFR calc non Af Amer: >90 mL/min (ref >90)
Glucose, Bld: 134 mg/dL — ABNORMAL HIGH (ref 70–99)
Potassium: 4.2 mEq/L (ref 3.5–5.1)
Sodium: 138 mEq/L (ref 135–145)

## 2011-11-26 MED ORDER — OXYCODONE-ACETAMINOPHEN 5-325 MG PO TABS
2.0000 | ORAL_TABLET | ORAL | Status: DC | PRN
  Administered 2011-11-26 – 2011-11-27 (×5): 2 via ORAL
  Filled 2011-11-26 (×5): qty 2

## 2011-11-26 NOTE — Progress Notes (Signed)
UR chart review completed.  

## 2011-11-26 NOTE — Progress Notes (Signed)
1 Day Post-Op Procedure(s) (LRB): EXPLORATORY LAPAROTOMY (N/A) SALPINGO OOPHERECTOMY (Bilateral)  Subjective: Patient reports incisional pain and tolerating PO.    Objective: I have reviewed patient's vital signs, intake and output, medications and labs.  General: alert, cooperative and appears stated age Abdomen is soft, nontender and nondistended Incision is clean, dry and intact  Assessment: s/p Procedure(s) (LRB): EXPLORATORY LAPAROTOMY (N/A) SALPINGO OOPHERECTOMY (Bilateral): stable, progressing well and not tolerating diet  Plan: Advance diet Encourage ambulation Advance to PO medication  LOS: 1 day    Tammy Bowman,Amylee L 11/26/2011, 7:01 AM

## 2011-11-27 MED ORDER — IBUPROFEN 600 MG PO TABS: 600.0000 mg | ORAL_TABLET | Freq: Four times a day (QID) | ORAL | Status: AC | PRN

## 2011-11-27 MED ORDER — OXYCODONE-ACETAMINOPHEN 5-325 MG PO TABS: 2.0000 | ORAL_TABLET | ORAL | Status: AC | PRN

## 2011-11-27 NOTE — Discharge Summary (Signed)
Admission Diagnosis: Pelvic Pain, Ovarian Cysts  Discharge Diagnosis: Same, Pelvic adhesions  Hospital Course: 46 year old patient with chronic pelvic pain and bilateral ovarian cysts by ultrasound. She underwent a Laparotomy and BSO and lysis of adhesions. The surgery was uneventful. She had a good post op course. By POD #2 she was ambulating, voiding, tolerating regular diet and had very good pain control. She remained afebrile with stable vital signs. She was discharged home with Percocet and Ibuprofen.  She will followup in 1 week for an incision check. She was advised on no driving for 1 week.

## 2011-11-27 NOTE — Progress Notes (Signed)
2 Days Post-Op Procedure(s) (LRB): EXPLORATORY LAPAROTOMY (N/A) SALPINGO OOPHERECTOMY (Bilateral)  Subjective: Patient reports incisional pain, tolerating PO, + flatus and no problems voiding.    Objective: I have reviewed patient's vital signs, intake and output, medications and labs.  General: alert, cooperative and appears stated age Abdomen soft and non tender Incision is clean, dry and intact Assessment: s/p Procedure(s) (LRB): EXPLORATORY LAPAROTOMY (N/A) SALPINGO OOPHERECTOMY (Bilateral): stable, progressing well and tolerating diet  Plan: Discharge home followup in 1 week  LOS: 2 days    Leonard Feigel,Hara L 11/27/2011, 6:14 AM

## 2011-11-27 NOTE — Progress Notes (Signed)
Pt d/c teaching complete  Ambulated out 

## 2011-12-13 ENCOUNTER — Inpatient Hospital Stay (HOSPITAL_COMMUNITY)
Admission: AD | Admit: 2011-12-13 | Discharge: 2011-12-16 | DRG: 453 | Disposition: A | Discharge status: Home / Self Care | Payer: BC Managed Care – PPO | Source: Ambulatory Visit | Attending: Obstetrics and Gynecology | Admitting: Obstetrics and Gynecology

## 2011-12-13 ENCOUNTER — Encounter (HOSPITAL_COMMUNITY): Payer: Self-pay

## 2011-12-13 DIAGNOSIS — R5082 Postprocedural fever: Secondary | ICD-10-CM | POA: Diagnosis present

## 2011-12-13 DIAGNOSIS — R509 Fever, unspecified: Secondary | ICD-10-CM

## 2011-12-13 DIAGNOSIS — R1031 Right lower quadrant pain: Secondary | ICD-10-CM | POA: Diagnosis present

## 2011-12-13 DIAGNOSIS — R1011 Right upper quadrant pain: Secondary | ICD-10-CM | POA: Diagnosis present

## 2011-12-13 DIAGNOSIS — Y838 Other surgical procedures as the cause of abnormal reaction of the patient, or of later complication, without mention of misadventure at the time of the procedure: Secondary | ICD-10-CM | POA: Diagnosis present

## 2011-12-13 DIAGNOSIS — IMO0002 Reserved for concepts with insufficient information to code with codable children: Secondary | ICD-10-CM

## 2011-12-13 LAB — URINALYSIS, ROUTINE W REFLEX MICROSCOPIC
Bilirubin Urine: NEGATIVE
Glucose, UA: NEGATIVE mg/dL
Hgb urine dipstick: NEGATIVE
Specific Gravity, Urine: >1.03 — ABNORMAL HIGH (ref 1.005–1.030)

## 2011-12-13 MED ORDER — LACTATED RINGERS IV BOLUS (SEPSIS)
1000.0000 mL | Freq: Once | INTRAVENOUS | Status: AC
  Administered 2011-12-14: 1000 mL via INTRAVENOUS

## 2011-12-13 NOTE — MAU Provider Note (Signed)
History     CSN: [id number]  Arrival date and time: 12/13/11 2150   First Provider Initiated Contact with Patient 12/13/11 2302     45 y.[id number]  Chief Complaint  Patient presents with  . Abdominal Pain  . Emesis  . Nausea   HPI  Pt presents to MAU with RLQ and RUQ pain radiating to Right lower back.  She reports feeling like a fist is pushing into her ribs from the inside.  She has had n/v since the pain started this morning.  She also has soreness at her incision with some leakage of fluid yesterday and today from the site.  She denies vaginal bleeding, pelvic pain, urinary symptoms, h/a, dizziness, or chills.    OB History    Grav Para Term Preterm Abortions TAB SAB Ect Mult Living   9 3 2 1 5  5   3       Past Medical History  Diagnosis Date  . GERD (gastroesophageal reflux disease)   . Blood transfusion 1990's    [facility] - unsure of # of units  . Headache     last one 1 yr ago, no meds    Past Surgical History  Procedure Date  . Abdominal hysterectomy   . Wisdom tooth extraction   . Svd  [id number]    x 2  . Cesarean section 1994  . Tubal ligation 1994  . Cholecystectomy 1991  . Colonscopy 2012  . Hernia repair   . Tummy tuck   . Laparotomy 11/25/2011    Procedure: EXPLORATORY LAPAROTOMY;  Surgeon: Jeani Hawking, MD;  Location: [facility];  Service: Gynecology;  Laterality: N/A;  Lysis Of Adhesions  . Salpingoophorectomy 11/25/2011    Procedure: SALPINGO OOPHERECTOMY;  Surgeon: Jeani Hawking, MD;  Location: [facility];  Service: Gynecology;  Laterality: Bilateral;    History reviewed. No pertinent family history.  History  Substance Use Topics  . Smoking status: Never Smoker   . Smokeless tobacco: Never Used  . Alcohol Use: No     Twice year    Allergies:  Allergies  Allergen Reactions  . Latex Hives    itchy    Prescriptions prior to admission  Medication Sig Dispense Refill  . acetaminophen (TYLENOL) 500 MG tablet Take 1,000 mg by  mouth daily as needed. Side pain      . dexlansoprazole (DEXILANT) 60 MG capsule Take 60 mg by mouth daily.        Review of Systems  Constitutional: Negative for chills and malaise/fatigue.  Eyes: Negative for blurred vision.  Respiratory: Negative for cough and shortness of breath.   Cardiovascular: Negative for chest pain.  Gastrointestinal: Positive for nausea, vomiting and abdominal pain. Negative for heartburn.  Genitourinary: Positive for flank pain. Negative for dysuria, urgency and frequency.  Musculoskeletal: Negative.   Neurological: Negative for dizziness and headaches.  Psychiatric/Behavioral: Negative for depression.   Physical Exam    Patient Vitals for the past 24 hrs:  BP Temp Temp src Pulse Resp SpO2 Height Weight  12/14/11 0336 138/88 mmHg 98.8 F (37.1 C) Oral 103  20  96 % - -  12/14/11 0222 139/80 mmHg 98.1 F (36.7 C) Oral 105  16  - - -  12/13/11 2228 134/88 mmHg - - 117  20  98 % - -  12/13/11 2222 135/102 mmHg 100.6 F (38.1 C) Oral 113  20  98 % - -  12/13/11 2219 - - - - - - 5'  2" (1.575 m) 82.158 kg (181 lb 2 oz)   Physical Exam  Nursing note and vitals reviewed. Constitutional: She is oriented to person, place, and time. She appears well-developed and well-nourished.  Neck: Normal range of motion.  Cardiovascular: Normal rate, regular rhythm and normal heart sounds.   Respiratory: Effort normal and breath sounds normal.  GI: Soft. Bowel sounds are normal. She exhibits no distension and no mass. There is tenderness in the right upper quadrant and periumbilical area. There is no rebound, no guarding, no CVA tenderness and no tenderness at McBurney's point.    Musculoskeletal: Normal range of motion.  Neurological: She is alert and oriented to person, place, and time.  Skin: Skin is warm and dry.  Psychiatric: She has a normal mood and affect. Her behavior is normal. Judgment and thought content normal.   Results for orders placed during the  hospital encounter of 12/13/11 (from the past 24 hour(s))  URINALYSIS, ROUTINE W REFLEX MICROSCOPIC     Status: Abnormal   Collection Time   12/13/11 10:11 PM      Component Value Range   Color, Urine YELLOW  YELLOW    APPearance CLEAR  CLEAR    Specific Gravity, Urine >1.030 (*) 1.005 - 1.030    pH 6.0  5.0 - 8.0    Glucose, UA NEGATIVE  NEGATIVE (mg/dL)   Hgb urine dipstick NEGATIVE  NEGATIVE    Bilirubin Urine NEGATIVE  NEGATIVE    Ketones, ur NEGATIVE  NEGATIVE (mg/dL)   Protein, ur NEGATIVE  NEGATIVE (mg/dL)   Urobilinogen, UA 0.2  0.0 - 1.0 (mg/dL)   Nitrite NEGATIVE  NEGATIVE    Leukocytes, UA NEGATIVE  NEGATIVE   COMPREHENSIVE METABOLIC PANEL     Status: Abnormal   Collection Time   12/13/11 11:34 PM      Component Value Range   Sodium 139  135 - 145 (mEq/L)   Potassium 4.1  3.5 - 5.1 (mEq/L)   Chloride 105  96 - 112 (mEq/L)   CO2 24  19 - 32 (mEq/L)   Glucose, Bld 133 (*) 70 - 99 (mg/dL)   BUN 15  6 - 23 (mg/dL)   Creatinine, Ser 1.61  0.50 - 1.10 (mg/dL)   Calcium 9.8  8.4 - 09.6 (mg/dL)   Total Protein 6.7  6.0 - 8.3 (g/dL)   Albumin 4.0  3.5 - 5.2 (g/dL)   AST 33  0 - 37 (U/L)   ALT 45 (*) 0 - 35 (U/L)   Alkaline Phosphatase 96  39 - 117 (U/L)   Total Bilirubin 0.6  0.3 - 1.2 (mg/dL)   GFR calc non Af Amer 76 (*) >90 (mL/min)   GFR calc Af Amer 88 (*) >90 (mL/min)  CBC     Status: Abnormal   Collection Time   12/13/11 11:34 PM      Component Value Range   WBC 9.8  4.0 - 10.5 (K/uL)   RBC 5.29 (*) 3.87 - 5.11 (MIL/uL)   Hemoglobin 15.4 (*) 12.0 - 15.0 (g/dL)   HCT 04.5  40.9 - 81.1 (%)   MCV 83.4  78.0 - 100.0 (fL)   MCH 29.1  26.0 - 34.0 (pg)   MCHC 34.9  30.0 - 36.0 (g/dL)   RDW 91.4  78.2 - 95.6 (%)   Platelets 281  150 - 400 (K/uL)   IMAGING US Abdomen Complete  12/14/2011  *RADIOLOGY REPORT*  Clinical Data:  Right upper quadrant abdominal pain, two weeks status post  bilateral salpingo-oophorectomy.  History of hysterectomy.  ABDOMINAL ULTRASOUND  COMPLETE  Comparison:  CT of the abdomen and pelvis performed 08/20/2011  Findings:  Gallbladder:  Status post cholecystectomy; no retained stones seen. The gallbladder fossa is unremarkable in appearance.  Common Bile Duct:  0.5 cm in diameter; within normal limits in caliber.  Liver:  Mildly increased parenchymal echogenicity and coarsened echotexture, compatible with mild fatty infiltration; no focal lesions identified.  Limited Doppler evaluation demonstrates normal blood flow within the liver.  IVC:  Unremarkable in appearance.  Pancreas:  Although the pancreas is difficult to visualize in its entirety due to overlying bowel gas, no focal pancreatic abnormality is identified.  Spleen:  9.4 cm in length; within normal limits in size and echotexture.  Right kidney:  10.0 cm in length; normal in size, configuration and parenchymal echogenicity.  No evidence of mass or hydronephrosis.  Left kidney:  10.9 cm in length; normal in size, configuration and parenchymal echogenicity.  No evidence of mass or hydronephrosis.  Abdominal Aorta:  Normal in caliber; no aneurysm identified.  The uterus and ovaries are surgically absent.  No focal abnormality is seen within the pelvis.  A small amount of fluid is noted within the soft tissues just inferior to the patient's recent operative site.  This may reflect a small postoperative seroma.  No focal abnormalities are seen at the right lower quadrant.  IMPRESSION:  1.  Status post cholecystectomy; gallbladder fossa unremarkable in appearance. 2.  No focal abnormalities seen at the right lower quadrant. 3.  Small amount of fluid within the soft tissues just inferior to the patient's recent operative site; this may reflect a small postoperative seroma.  4.  Mild fatty infiltration within the liver.  Original Report Authenticated By: Tonia Ghent, M.D.   MAU Course  Procedures CBC, CMP, U/A  MDM Called Dr Renaldo Fiddler to discuss pt symptoms and assessment.  Plan to draw labs,  U/S of abdomen.  Assessment and Plan  A: RUQ pain of unexplained origin 2 weeks post hysterectomy  P: Admit for observation Percocet 5/325 x2 tabs Q4 hours for pain LR 125/hour Clear liquid diet Dr Renaldo Fiddler to see pt in am  Tammy Bowman, Tammy Bowman 12/13/2011, 11:18 PM

## 2011-12-13 NOTE — MAU Note (Signed)
Patient is here with c/o right sided stabbing pain, n/v that started at 1600pm. She states that she has had diarrhea since her surgery on jan 26th (ovary and fallopian tubes removal by dr Vincente Poli).  She states that the incision sites has been leaking and dr Vincente Poli evaluated it on Thursday and said that it looks okay.

## 2011-12-14 ENCOUNTER — Inpatient Hospital Stay (HOSPITAL_COMMUNITY): Payer: BC Managed Care – PPO

## 2011-12-14 LAB — CBC
HCT: 44.1 % (ref 36.0–46.0)
MCHC: 34.9 g/dL (ref 30.0–36.0)
Platelets: 281 10*3/uL (ref 150–400)
RDW: 13.5 % (ref 11.5–15.5)
WBC: 9.8 10*3/uL (ref 4.0–10.5)

## 2011-12-14 LAB — COMPREHENSIVE METABOLIC PANEL
ALT: 45 U/L — ABNORMAL HIGH (ref 0–35)
Alkaline Phosphatase: 96 U/L (ref 39–117)
BUN: 15 mg/dL (ref 6–23)
CO2: 24 mEq/L (ref 19–32)
Chloride: 105 mEq/L (ref 96–112)
GFR calc Af Amer: 88 mL/min — ABNORMAL LOW (ref >90)
GFR calc non Af Amer: 76 mL/min — ABNORMAL LOW (ref >90)
Glucose, Bld: 133 mg/dL — ABNORMAL HIGH (ref 70–99)
Potassium: 4.1 mEq/L (ref 3.5–5.1)
Sodium: 139 mEq/L (ref 135–145)
Total Bilirubin: 0.6 mg/dL (ref 0.3–1.2)

## 2011-12-14 MED ORDER — SODIUM CHLORIDE 0.9 % IV SOLN
INTRAVENOUS | Status: DC
  Administered 2011-12-14 – 2011-12-15 (×2): via INTRAVENOUS

## 2011-12-14 MED ORDER — ONDANSETRON HCL 4 MG/2ML IJ SOLN
4.0000 mg | INTRAMUSCULAR | Status: AC
  Administered 2011-12-14: 4 mg via INTRAVENOUS
  Filled 2011-12-14: qty 2

## 2011-12-14 MED ORDER — IOHEXOL 300 MG/ML  SOLN
100.0000 mL | Freq: Once | INTRAMUSCULAR | Status: AC | PRN
  Administered 2011-12-14: 100 mL via INTRAVENOUS

## 2011-12-14 MED ORDER — HYDROMORPHONE HCL PF 1 MG/ML IJ SOLN
1.0000 mg | INTRAMUSCULAR | Status: AC
  Administered 2011-12-14: 1 mg via INTRAVENOUS
  Filled 2011-12-14: qty 1

## 2011-12-14 MED ORDER — DEXTROSE 5 % IV SOLN
2.0000 g | INTRAVENOUS | Status: DC
  Administered 2011-12-14: 2 g via INTRAVENOUS
  Filled 2011-12-14 (×2): qty 2

## 2011-12-14 MED ORDER — LACTATED RINGERS IV SOLN: INTRAVENOUS | Status: DC

## 2011-12-14 MED ORDER — OXYCODONE-ACETAMINOPHEN 5-325 MG PO TABS
2.0000 | ORAL_TABLET | ORAL | Status: DC | PRN
  Administered 2011-12-14 – 2011-12-15 (×8): 2 via ORAL
  Administered 2011-12-16: 1 via ORAL
  Filled 2011-12-14 (×8): qty 2
  Filled 2011-12-14: qty 1

## 2011-12-14 MED ORDER — LACTATED RINGERS IV SOLN
INTRAVENOUS | Status: DC
  Administered 2011-12-14 (×2): via INTRAVENOUS

## 2011-12-14 MED ORDER — OXYCODONE-ACETAMINOPHEN 5-325 MG PO TABS: 2.0000 | ORAL_TABLET | ORAL | Status: DC | PRN

## 2011-12-14 NOTE — H&P (Signed)
46 yo 2 wks s/p exploratory laparotomy w/ BSO and LOA presents for RUQ & RLQ pain.  Reports h/o IBS also.  Has had difficulty postoperatively with diarrhea and constipation but began to have sharp RQ pain yesterday.  Pain a/w nausea.  No VB.  Past history - See MAU provider note  AF, VSS Tm = 100.6, Tc = 99.9 Gen - NAD Abd - good BS all quadrants, soft, tender right upper & lower quadrant Ext - NT PV - deferred  Labs reviewed.  No increase in WBC  A/P:  Abdominal US was non-diagnostic - Plan for CT abd/pv today. Percocet prn pain, IVF

## 2011-12-14 NOTE — Plan of Care (Signed)
Problem: Phase I Progression Outcomes Goal: Pain controlled with appropriate interventions Outcome: Completed/Met Date Met:  12/14/11 Placed on Percocet and likes this better than IV Dilaudid for pain control Goal: OOB as tolerated unless otherwise ordered Outcome: Completed/Met Date Met:  12/14/11 Tolerates out of bed well Goal: Initial discharge plan identified Outcome: Progressing Diagnosis Pain Control When to call the MD Goal: Hemodynamically stable Outcome: Progressing Has a low grade fever Goal: Other Phase I Outcomes/Goals Outcome: Progressing Cause of pain identified  Problem: Phase III Progression Outcomes Goal: Activity at appropriate level-compared to baseline (UP IN CHAIR FOR HEMODIALYSIS) Outcome: Completed/Met Date Met:  12/14/11 Can move better due to pain decrease

## 2011-12-14 NOTE — Progress Notes (Signed)
CT results discussed with RN over phone.  Seroma vs abcess in anterior abdominal wall (at site of incision).   Incision is without erythema or drainage but given pain and low grade fever, will treat with IV antibiotics overnight.  Continue percocet for pain and advance to regular diet.

## 2011-12-15 MED ORDER — DIPHENHYDRAMINE HCL 25 MG PO CAPS
25.0000 mg | ORAL_CAPSULE | Freq: Four times a day (QID) | ORAL | Status: DC | PRN
  Administered 2011-12-15: 25 mg via ORAL
  Filled 2011-12-15: qty 1

## 2011-12-15 MED ORDER — PROMETHAZINE HCL 25 MG PO TABS
25.0000 mg | ORAL_TABLET | Freq: Four times a day (QID) | ORAL | Status: DC | PRN
  Administered 2011-12-15: 25 mg via ORAL
  Filled 2011-12-15: qty 1

## 2011-12-15 MED ORDER — SODIUM CHLORIDE 0.9 % IV SOLN
3.0000 g | Freq: Three times a day (TID) | INTRAVENOUS | Status: DC
  Administered 2011-12-15: 3 g via INTRAVENOUS
  Filled 2011-12-15 (×3): qty 3

## 2011-12-15 MED ORDER — AMOXICILLIN-POT CLAVULANATE 875-125 MG PO TABS
1.0000 | ORAL_TABLET | Freq: Two times a day (BID) | ORAL | Status: DC
  Administered 2011-12-15 – 2011-12-16 (×3): 1 via ORAL
  Filled 2011-12-15 (×3): qty 1

## 2011-12-15 MED ORDER — ONDANSETRON 8 MG PO TBDP
8.0000 mg | ORAL_TABLET | Freq: Three times a day (TID) | ORAL | Status: DC | PRN
Start: 2011-12-15 — End: 2011-12-16
  Filled 2011-12-15: qty 1

## 2011-12-15 NOTE — Progress Notes (Signed)
Subjective: Patient reports incisional pain, tolerating PO and no problems voiding.  She reports pain in right flank area.  Objective: I have reviewed patient's vital signs, intake and output, medications and labs.  Vaginal Bleeding: none Abdomen is inspected and is slightly tender in right flank area, her incision is not erythematous but I was able to express a moderate amount of serosanguinous drainage and patient felt relieved   Assessment/Plan: Wound seroma Change to Unasyn Reevaluate at lunchtime Consider discharge then  LOS: 2 days    Cipriana Biller,Marja L 12/15/2011, 8:38 AM

## 2011-12-15 NOTE — Plan of Care (Signed)
Problem: Discharge Progression Outcomes Goal: Activity appropriate for discharge plan Outcome: Completed/Met Date Met:  12/15/11 Ambulates well without assist

## 2011-12-15 NOTE — Progress Notes (Signed)
Patient is feeling ok Exam With expression I got a lot of serosanguinous drainage out of incision This should help a lot Will observe overnight and probable discharge in the am

## 2011-12-15 NOTE — Progress Notes (Signed)
UR chart review completed.  

## 2011-12-16 MED ORDER — OXYCODONE-ACETAMINOPHEN 5-325 MG PO TABS: 2.0000 | ORAL_TABLET | ORAL | Status: AC | PRN

## 2011-12-16 MED ORDER — AMOXICILLIN-POT CLAVULANATE 875-125 MG PO TABS: 1.0000 | ORAL_TABLET | Freq: Two times a day (BID) | ORAL | Status: AC

## 2011-12-16 MED ORDER — ONDANSETRON 8 MG PO TBDP: 8.0000 mg | ORAL_TABLET | Freq: Three times a day (TID) | ORAL | Status: AC | PRN

## 2011-12-16 NOTE — Discharge Summary (Signed)
Admission Diagnosis: Postop Wound Seroma  Discharge Diagnosis: Same  Hospital Course: 46 year old female status post Exploratory laparotomy, LOA, and BSO admitted with low grade fever and abdominal pain. On admission, CBC was normal and CT scan of abdomen and pelvis revealed only a fluid collection below the incision consistent with a wound seroma. This wound seroma may have occurred because of history of abdominoplasty and had significant scar tissue in the subcutaneous area. She did very well and by HD #2 I was able to express the majority of her seroma. She felt better after that and pain and temp went down. She was discharged home today in good condition. Discharged with po antibiotics, percocet and ibuprofen Followup with Dr. Vincente Poli in 2 days.

## 2011-12-16 NOTE — Progress Notes (Signed)
Subjective: Patient reports incisional pain, tolerating PO, + flatus and no problems voiding.  Incisional pain is less  Objective: I have reviewed patient's vital signs, intake and output, medications and labs.  Abdomen is soft and nondistended With palpation I can express a moderate amount of serous fluid without odor from pinhole sized opening in the middle of her incision. Drainage is much less compared to yesterday  Assessment/Plan: Wound Seroma Resolving Discharge home with percocet, ibuprofen and antibiotics followup in 2 days with Dr. Vincente Poli  LOS: 3 days    Aryona Sill,Muranda L 12/16/2011, 8:29 AM

## 2011-12-16 NOTE — Progress Notes (Signed)
Pt d/c home ambulated out  Questions answered

## 2013-07-07 ENCOUNTER — Encounter: Payer: Self-pay | Admitting: Neurology

## 2013-07-07 ENCOUNTER — Ambulatory Visit (INDEPENDENT_AMBULATORY_CARE_PROVIDER_SITE_OTHER): Payer: BC Managed Care – PPO | Admitting: Neurology

## 2013-07-07 VITALS — BP 134/92 | HR 78 | Ht 62.5 in | Wt 169.0 lb

## 2013-07-07 DIAGNOSIS — R4182 Altered mental status, unspecified: Secondary | ICD-10-CM

## 2013-07-07 DIAGNOSIS — M542 Cervicalgia: Secondary | ICD-10-CM | POA: Insufficient documentation

## 2013-07-07 DIAGNOSIS — R55 Syncope and collapse: Secondary | ICD-10-CM | POA: Insufficient documentation

## 2013-07-07 NOTE — Progress Notes (Signed)
[facility]    Provider:  Dr Hosie Poisson Referring Provider: Barbie Banner, MD Primary Care Physician:  Pamelia Hoit, MD  CC:  dizziness  HPI:  Tammy Bowman is a 47 y.o. female here as a referral from Dr. Andrey Campanile for dizziness  Started 3 months ago, goes on continously, feels lightheaded almost to the point of passing out. Lightheaded sensation is all the time but worse when standing, when having severe episode will lay head down on counter. During these episodes gets cold and clammy, feels her heart beat gets rapid, gets sensation of pulsating in her neck. No episodes of blacking out. Has soreness in her muscles. During some of the episodes she notes people tell her she acts differently, zones out, not her normal self. Is "mean" after the event. No history of seizures. Was bucked off a horse around 5 years ago, feels that started the whole process. No other head trauma.    Also notes some visual concerns.  Constantly needing to squint her eyes to re-focus, recently had eye exam, told overall her eyes have changed. Has not been able to find a prescription that works for her.   Has irritable bowel syndrome. Has a history of migraines but has not had one in over a year.   Review of Systems: Out of a complete 14 system review, the patient complains of only the following symptoms, and all other reviewed systems are negative. Positive for fatigue blurred vision no vision eye pain headache weakness dizziness increased thirst joint pain aching muscles diarrhea constipation urinary problems ringing in ears History   Social History  . Marital Status: Married    Spouse Name: Molli Hazard     Number of Children: 3  . Years of Education: 12th   Occupational History  .     Social History Main Topics  . Smoking status: Never Smoker   . Smokeless tobacco: Never Used  . Alcohol Use: No     Comment: Twice year  . Drug Use: No  . Sexual Activity: Yes    Birth Control/  Protection: Surgical   Other Topics Concern  . Not on file   Social History Narrative   Patient lives at home with her husband and daughter.    Patient does not work.    Patient has a high school education.    Patient drinks 2-4 cups of caffeine daily.     History reviewed. No pertinent family history.  Past Medical History  Diagnosis Date  . GERD (gastroesophageal reflux disease)   . Blood transfusion 1990's    [facility] - unsure of # of units  . Headache(784.0)     last one 1 yr ago, no meds    Past Surgical History  Procedure Laterality Date  . Abdominal hysterectomy    . Wisdom tooth extraction    . Svd   4098,1191    x 2  . Cesarean section  1994  . Tubal ligation  1994  . Cholecystectomy  1991  . Colonscopy  2012  . Hernia repair    . Tummy tuck    . Laparotomy  11/25/2011    Procedure: EXPLORATORY LAPAROTOMY;  Surgeon: Jeani Hawking, MD;  Location: [facility];  Service: Gynecology;  Laterality: N/A;  Lysis Of Adhesions  . Salpingoophorectomy  11/25/2011    Procedure: SALPINGO OOPHERECTOMY;  Surgeon: Jeani Hawking, MD;  Location: [facility];  Service: Gynecology;  Laterality: Bilateral;    Current Outpatient Prescriptions  Medication  Sig Dispense Refill  . acetaminophen (TYLENOL) 500 MG tablet Take 1,000 mg by mouth daily as needed. Side pain      . Estradiol (ELESTRIN) 0.52 MG/0.87 GM (0.06%) GEL Apply topically.      Marland Kitchen ibuprofen (ADVIL,MOTRIN) 600 MG tablet Take 600 mg by mouth every 6 (six) hours as needed for pain.      Marland Kitchen zolpidem (AMBIEN) 10 MG tablet Take 10 mg by mouth at bedtime.       No current facility-administered medications for this visit.    Allergies as of 07/07/2013 - Review Complete 07/07/2013  Allergen Reaction Noted  . Latex Hives 11/10/2011    Vitals: BP 134/92  Pulse 78  Ht 5' 2.5" (1.588 m)  Wt 169 lb (76.658 kg)  BMI 30.4 kg/m2 Last Weight:  Wt Readings from Last 1 Encounters:  07/07/13 169 lb (76.658 kg)   Last  Height:   Ht Readings from Last 1 Encounters:  07/07/13 5' 2.5" (1.588 m)     Physical exam: Exam: Gen: NAD, conversant Eyes: anicteric sclerae, moist conjunctivae HENT: Atraumatic, oropharynx clear Neck: Trachea midline; supple,  Lungs: CTA, no wheezing, rales, rhonic                          CV: RRR, no MRG, no carotid bruits Abdomen: Soft, non-tender;  Extremities: No peripheral edema  Skin: Normal temperature, no rash,  Psych: Appropriate affect, pleasant  Neuro: MS: AA&Ox3, appropriately interactive, normal affect   Speech: fluent w/o paraphasic error  Memory: good recent and remote recall  CN: PERRL, EOMI no nystagmus, no ptosis, sensation intact to LT V1-V3 bilat, face symmetric, no weakness, hearing grossly intact, palate elevates symmetrically, shoulder shrug 5/5 bilat,  tongue protrudes midline, no fasiculations noted.  Motor: normal bulk and tone Strength: 5/5  In all extremities  Coord: rapid alternating and point-to-point (FNF, HTS) movements intact.  Reflexes: symmetrical, bilat downgoing toes  Sens: LT intact in all extremities  Gait: posture, stance, stride and arm-swing normal. Tandem gait intact. Able to walk on heels and toes. Romberg absent.   Assessment:  After physical and neurologic examination, review of laboratory studies, imaging, neurophysiology testing and pre-existing records, assessment will be reviewed on the problem list.  Plan:  Treatment plan and additional workup will be reviewed under Problem List.  Symptoms most consistent with pre-syncopal episode. Cannot r/o seizure based on confusion/change in personality during and after the event.  1)Pre-syncope 2)Altered mental status 3)Neck pain  -carotid ultrasound -EEG -cardiology referral -physical therapy referral for neck pain -follow up once workup complete

## 2013-07-07 NOTE — Patient Instructions (Signed)
Overall you are doing fairly well but I do want to suggest a few things today:   As far as diagnostic testing:  1)EEG 2)Carotid ultrasound 3)cardiology referral  We will place a referral to physical therapy to help treat your neck pain  Please also call us for any test results so we can go over those with you on the phone.  My clinical assistant and will answer any of your questions and relay your messages to me and also relay most of my messages to you.   Our phone number is [phone]. We also have an after hours call service for urgent matters and there is a physician on-call for urgent questions. For any emergencies you know to call 911 or go to the nearest emergency room

## 2013-07-08 ENCOUNTER — Ambulatory Visit (INDEPENDENT_AMBULATORY_CARE_PROVIDER_SITE_OTHER): Payer: BC Managed Care – PPO | Admitting: Radiology

## 2013-07-08 ENCOUNTER — Encounter: Payer: Self-pay | Admitting: Neurology

## 2013-07-08 DIAGNOSIS — R4182 Altered mental status, unspecified: Secondary | ICD-10-CM

## 2013-07-08 DIAGNOSIS — R55 Syncope and collapse: Secondary | ICD-10-CM

## 2013-07-08 NOTE — Procedures (Signed)
  History:  Tammy Bowman is a 47 year old patient with a three-month history of episodes of lightheaded sensations, "zoning out", and near syncope while standing. The patient is being evaluated for these episodes.  This is a routine EEG. No skull defects are noted. Medications include Tylenol, estradiol, Advil, and Ambien.   EEG classification: Normal awake  Description of the recording: The background rhythms of this recording consists of a fairly well modulated medium amplitude alpha rhythm of 10 Hz that is reactive to eye opening and closure. As the record progresses, the patient appears to remain in the waking state throughout the recording. Photic stimulation was performed, resulting in a bilateral and symmetric photic driving response. Hyperventilation was also performed, resulting in a good buildup of the background rhythm activities without significant slowing seen. At no time during the recording does there appear to be evidence of spike or spike wave discharges or evidence of focal slowing. EKG monitor shows no evidence of cardiac rhythm abnormalities with a heart rate of 66.  Impression: This is a normal EEG recording in the waking state. No evidence of ictal or interictal discharges are seen.

## 2013-07-11 ENCOUNTER — Telehealth: Payer: Self-pay | Admitting: Neurology

## 2013-07-11 NOTE — Telephone Encounter (Signed)
Please let her know her EEG was normal. We are awaiting the carotid ultrasound. Thanks

## 2013-07-12 NOTE — Telephone Encounter (Signed)
Call pt about her EEG results, pt verbalized understanding. Let pt know that Dr. Hosie Poisson was waiting on her carotid ultrasound, pt stated that it was schedule for next week.

## 2013-07-20 ENCOUNTER — Ambulatory Visit (INDEPENDENT_AMBULATORY_CARE_PROVIDER_SITE_OTHER): Payer: BC Managed Care – PPO

## 2013-07-20 DIAGNOSIS — R4182 Altered mental status, unspecified: Secondary | ICD-10-CM

## 2013-07-20 DIAGNOSIS — R55 Syncope and collapse: Secondary | ICD-10-CM

## 2013-07-26 ENCOUNTER — Ambulatory Visit: Payer: BC Managed Care – PPO | Admitting: Cardiology

## 2013-08-01 ENCOUNTER — Other Ambulatory Visit: Payer: Self-pay | Admitting: Obstetrics and Gynecology

## 2013-08-04 ENCOUNTER — Other Ambulatory Visit: Payer: Self-pay

## 2013-08-17 ENCOUNTER — Ambulatory Visit (INDEPENDENT_AMBULATORY_CARE_PROVIDER_SITE_OTHER): Payer: Self-pay | Admitting: General Surgery

## 2014-07-31 ENCOUNTER — Encounter: Payer: Self-pay | Admitting: Neurology

## 2014-08-07 ENCOUNTER — Other Ambulatory Visit: Payer: Self-pay | Admitting: Obstetrics and Gynecology

## 2014-08-08 LAB — CYTOLOGY - PAP

## 2015-04-20 ENCOUNTER — Ambulatory Visit (HOSPITAL_COMMUNITY)
Admission: RE | Admit: 2015-04-20 | Discharge: 2015-04-20 | Disposition: A | Discharge status: Home / Self Care | Payer: BLUE CROSS/BLUE SHIELD | Source: Ambulatory Visit | Attending: Obstetrics and Gynecology | Admitting: Obstetrics and Gynecology

## 2015-04-20 ENCOUNTER — Other Ambulatory Visit (HOSPITAL_COMMUNITY): Payer: Self-pay | Admitting: Obstetrics and Gynecology

## 2015-04-20 DIAGNOSIS — M79601 Pain in right arm: Secondary | ICD-10-CM | POA: Diagnosis not present

## 2015-04-20 DIAGNOSIS — R609 Edema, unspecified: Secondary | ICD-10-CM

## 2015-04-20 DIAGNOSIS — M7989 Other specified soft tissue disorders: Secondary | ICD-10-CM | POA: Insufficient documentation

## 2015-04-20 NOTE — Progress Notes (Signed)
Right upper extremity venous duplex completed:  No evidence of DVT or superficial thrombosis.    

## 2015-09-10 ENCOUNTER — Other Ambulatory Visit: Payer: Self-pay | Admitting: Orthopedic Surgery

## 2017-06-22 ENCOUNTER — Other Ambulatory Visit: Payer: Self-pay | Admitting: Otolaryngology

## 2017-06-22 DIAGNOSIS — H9319 Tinnitus, unspecified ear: Secondary | ICD-10-CM

## 2017-07-03 ENCOUNTER — Ambulatory Visit
Admission: RE | Admit: 2017-07-03 | Discharge: 2017-07-03 | Disposition: A | Discharge status: Home / Self Care | Payer: PRIVATE HEALTH INSURANCE | Source: Ambulatory Visit | Attending: Otolaryngology | Admitting: Otolaryngology

## 2017-07-03 DIAGNOSIS — H9319 Tinnitus, unspecified ear: Secondary | ICD-10-CM

## 2017-07-03 MED ORDER — GADOBENATE DIMEGLUMINE 529 MG/ML IV SOLN
13.0000 mL | Freq: Once | INTRAVENOUS | Status: AC | PRN
  Administered 2017-07-03: 13 mL via INTRAVENOUS

## 2017-07-21 ENCOUNTER — Encounter: Payer: Self-pay | Admitting: Diagnostic Neuroimaging

## 2017-07-21 ENCOUNTER — Ambulatory Visit (INDEPENDENT_AMBULATORY_CARE_PROVIDER_SITE_OTHER): Payer: PRIVATE HEALTH INSURANCE | Admitting: Diagnostic Neuroimaging

## 2017-07-21 VITALS — BP 150/92 | HR 90 | Wt 177.0 lb

## 2017-07-21 DIAGNOSIS — R51 Headache: Secondary | ICD-10-CM

## 2017-07-21 DIAGNOSIS — D1800 Hemangioma unspecified site: Secondary | ICD-10-CM

## 2017-07-21 DIAGNOSIS — R519 Headache, unspecified: Secondary | ICD-10-CM

## 2017-07-21 DIAGNOSIS — H5712 Ocular pain, left eye: Secondary | ICD-10-CM

## 2017-07-21 DIAGNOSIS — H9312 Tinnitus, left ear: Secondary | ICD-10-CM

## 2017-07-21 NOTE — Progress Notes (Signed)
[facility]  PATIENT: Tammy Bowman DOB: Mar 12, 1966  REFERRING CLINICIAN: Loura Pardon, MD HISTORY FROM: patient, husband, daughter  REASON FOR VISIT: new consult    HISTORICAL  CHIEF COMPLAINT:  Chief Complaint  Patient presents with  . Abnormal MRI, ear pain    rm 7, husb- Tammy Bowman, dgtr- Tammy Bowman    HISTORY OF PRESENT ILLNESS:   51 year old female with hypertension, migraine, depression, anxiety, here for evaluation of ear pain, eye pain, neck pain, ringing in ears.  In September 2018 patient was outside bending, exerting herself, when all of a sudden she had sudden pressure sensation pain in left eye, pain in left ear, rating to the left neck, with ringing in ears.  Symptoms have continued since that time.  Symptoms worse with activity or exertion.  Patient initially was treated for possible ear infection or inflammation with prednisone and antibiotic courses.  Symptoms continued.  Patient had ENT evaluation who found no specific diagnosis.  Patient did have audiology testing demonstrating left sided hearing loss.  Patient then referred to me for further evaluation.  Patient does have history of headaches since 2007 with global pain sensation, photophobia and phonophobia.  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   REVIEW OF SYSTEMS: Full 14 system review of systems performed and negative with exception of: Memory loss confusion headache numbness weakness difficulty swallowing dizziness restless legs depression anxiety decreased energy change in appetite racing thoughts joint pain joint swelling aching muscles weight gain blurred vision shortness of breath swelling  both legs hearing loss ringing in ears trouble swallowing.   ALLERGIES: Allergies  Allergen Reactions  . Latex Hives    itchy  . Other Hives    shellfish    HOME MEDICATIONS: Outpatient Medications Prior to Visit  Medication Sig Dispense  Refill  . acetaminophen (TYLENOL) 500 MG tablet Take 1,000 mg by mouth daily as needed. Side pain    . Estradiol (ELESTRIN) 0.52 MG/0.87 GM (0.06%) GEL Apply topically.    Marland Kitchen ibuprofen (ADVIL,MOTRIN) 600 MG tablet Take 600 mg by mouth every 6 (six) hours as needed for pain.    Marland Kitchen zolpidem (AMBIEN) 10 MG tablet Take 10 mg by mouth at bedtime.     No facility-administered medications prior to visit.     PAST MEDICAL HISTORY: Past Medical History:  Diagnosis Date  . Blood transfusion 1990's   [facility] - unsure of # of units  . GERD (gastroesophageal reflux disease)   . Headache(784.0)    last one 1 yr ago, no meds, migraine  . Hypertension     PAST SURGICAL HISTORY: Past Surgical History:  Procedure Laterality Date  . ABDOMINAL HYSTERECTOMY  1999  . CESAREAN SECTION  1994  . CHOLECYSTECTOMY  1991  . colonscopy  2012  . HERNIA REPAIR    . LAPAROTOMY  11/25/2011   Procedure: EXPLORATORY LAPAROTOMY;  Surgeon: Tammy Mourning, MD;  Location: [facility];  Service: Gynecology;  Laterality: N/A;  Lysis Of Adhesions  . SALPINGOOPHORECTOMY  11/25/2011   Procedure: SALPINGO OOPHERECTOMY;  Surgeon: Tammy Mourning, MD;  Location: [facility];  Service: Gynecology;  Laterality: Bilateral;  . SVD   [id number]   x 2  . TUBAL LIGATION  1994  . Tummy Tuck    . WISDOM TOOTH EXTRACTION      FAMILY HISTORY: Family History  Problem Relation Age of Onset  . Multiple sclerosis Father   . Cancer Brother  thyroid  . Stroke Maternal Grandmother   . Stroke Maternal Grandfather     SOCIAL HISTORY:  Social History   Social History  . Marital status: Married    Spouse name: Tammy Bowman   . Number of children: 3  . Years of education: 12th   Occupational History  .  [employer]    07/21/17 not working   Social History Main Topics  . Smoking status: Never Smoker  . Smokeless tobacco: Never Used  . Alcohol use No     Comment: 07/21/17 3 monthly  . Drug use: No  .  Sexual activity: Yes    Birth control/ protection: Surgical   Other Topics Concern  . Not on file   Social History Narrative   Patient lives at home with her husband and daughter.    Patient does not work.    Patient has a high school education.    Patient drinks 2-4 cups of caffeine daily.      PHYSICAL EXAM  GENERAL EXAM/CONSTITUTIONAL: Vitals:  Vitals:   07/21/17 1126  BP: (!) 150/92  Pulse: 90  Weight: 177 lb (80.3 kg)     Body mass index is 31.86 kg/m.  Visual Acuity Screening   Right eye Left eye Both eyes  Without correction:     With correction: 20/30 20/30   Comments: 1 contact for close, 1 for far    Patient is in no distress; well developed, nourished and groomed; neck is supple  BILATERAL TYMPANIC MEMBRANES NORMAL  CARDIOVASCULAR:  Examination of carotid arteries is normal; no carotid bruits  Regular rate and rhythm, no murmurs  Examination of peripheral vascular system by observation and palpation is normal  EYES:  Ophthalmoscopic exam of optic discs and posterior segments is normal; no papilledema or hemorrhages  MUSCULOSKELETAL:  Gait, strength, tone, movements noted in Neurologic exam below  NEUROLOGIC: MENTAL STATUS:  No flowsheet data found.  awake, alert, oriented to person, place and time  recent and remote memory intact  normal attention and concentration  language fluent, comprehension intact, naming intact,   fund of knowledge appropriate  CRANIAL NERVE:   2nd - no papilledema on fundoscopic exam  2nd, 3rd, 4th, 6th - pupils equal and reactive to light, visual fields full to confrontation, extraocular muscles intact, no nystagmus  5th - facial sensation symmetric  7th - facial strength symmetric  8th - hearing intact  9th - palate elevates symmetrically, uvula midline  11th - shoulder shrug symmetric  12th - tongue protrusion midline  MOTOR:   normal bulk and tone, full strength in the BUE,  BLE  SENSORY:   normal and symmetric to light touch, temperature, vibration  COORDINATION:   finger-nose-finger, fine finger movements normal  REFLEXES:   deep tendon reflexes present and symmetric  GAIT/STATION:   narrow based gait; able to walk tandem; romberg is negative    DIAGNOSTIC DATA (LABS, IMAGING, TESTING) - I reviewed patient records, labs, notes, testing and imaging myself where available.  Lab Results  Component Value Date   WBC 9.8 12/13/2011   HGB 15.4 (H) 12/13/2011   HCT 44.1 12/13/2011   MCV 83.4 12/13/2011   PLT 281 12/13/2011      Component Value Date/Time   NA 139 12/13/2011 2334   K 4.1 12/13/2011 2334   CL 105 12/13/2011 2334   CO2 24 12/13/2011 2334   GLUCOSE 133 (H) 12/13/2011 2334   BUN 15 12/13/2011 2334   CREATININE 0.91 12/13/2011 2334  CALCIUM 9.8 12/13/2011 2334   PROT 6.7 12/13/2011 2334   ALBUMIN 4.0 12/13/2011 2334   AST 33 12/13/2011 2334   ALT 45 (H) 12/13/2011 2334   ALKPHOS 96 12/13/2011 2334   BILITOT 0.6 12/13/2011 2334   GFRNONAA 76 (L) 12/13/2011 2334   GFRAA 88 (L) 12/13/2011 2334   No results found for: CHOL, HDL, LDLCALC, LDLDIRECT, TRIG, CHOLHDL No results found for: HGBA1C No results found for: VITAMINB12 No results found for: TSH   07/03/17 MRI brain (with and without)  1. Normal MRI of the brain and inner ear structures. 2. 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 No abnormality on any other sequence to suggest the presence of a cavernous angioma.     ASSESSMENT AND PLAN  51 y.o. year old female here with new onset left eye pain, left ear pain, neck pain, ringing in left ear, left-sided hearing loss, worse with exertion.  MRI brain unremarkable.  Neurologic examination notable only for slightly decreased hearing on left side.  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  Other possibilities would include postviral syndrome or migraine variant.   Ddx: post-viral, migraine variant, glomus tumor  1. Left-sided tinnitus   2. Left temporal headache   3. Left eye pain   4. Glomus tumor      PLAN: - CTA head / neck (eval for paragangliomas / glomus tumors)  Orders Placed This Encounter  Procedures  . CT ANGIO HEAD W OR WO CONTRAST  . CT ANGIO NECK W OR WO CONTRAST   Return in about 3 months (around 10/21/2017).  I reviewed images, labs, notes, records myself. I summarized findings and reviewed with patient, for this high risk condition (new onset tinnitus; h/o glomus tumor) requiring high complexity decision making.    Penni Bombard, MD 47/65/4650, 35:46 AM Certified in Neurology, Neurophysiology and Neuroimaging  [facility] [address] [place], [place] [address] [phone]

## 2017-07-21 NOTE — Patient Instructions (Signed)
-   check CTA head / neck scan

## 2017-07-24 ENCOUNTER — Ambulatory Visit
Admission: RE | Admit: 2017-07-24 | Discharge: 2017-07-24 | Disposition: A | Discharge status: Home / Self Care | Payer: PRIVATE HEALTH INSURANCE | Source: Ambulatory Visit | Attending: Diagnostic Neuroimaging | Admitting: Diagnostic Neuroimaging

## 2017-07-24 DIAGNOSIS — R519 Headache, unspecified: Secondary | ICD-10-CM

## 2017-07-24 DIAGNOSIS — H9312 Tinnitus, left ear: Secondary | ICD-10-CM

## 2017-07-24 DIAGNOSIS — D1800 Hemangioma unspecified site: Secondary | ICD-10-CM

## 2017-07-24 DIAGNOSIS — R51 Headache: Secondary | ICD-10-CM

## 2017-07-24 DIAGNOSIS — H5712 Ocular pain, left eye: Secondary | ICD-10-CM

## 2017-07-24 MED ORDER — IOPAMIDOL (ISOVUE-370) INJECTION 76%
75.0000 mL | Freq: Once | INTRAVENOUS | Status: AC | PRN
  Administered 2017-07-24: 75 mL via INTRAVENOUS

## 2017-08-06 ENCOUNTER — Telehealth: Payer: Self-pay

## 2017-08-06 NOTE — Telephone Encounter (Signed)
I spoke with patient and made her aware of these results. She voiced understanding and appreciation and will keep follow up in 3 months.

## 2017-08-06 NOTE — Telephone Encounter (Signed)
-----   Message from Penni Bombard, MD sent at 08/05/2017  5:08 PM EST ----- Normal imaging results. Please call patient. Continue current plan. -VRP

## 2017-11-04 ENCOUNTER — Ambulatory Visit: Payer: PRIVATE HEALTH INSURANCE | Admitting: Diagnostic Neuroimaging

## 2023-05-17 ENCOUNTER — Telehealth: Payer: Commercial Managed Care - PPO | Admitting: Nurse Practitioner

## 2023-05-17 ENCOUNTER — Other Ambulatory Visit: Payer: Self-pay | Admitting: Nurse Practitioner

## 2023-05-17 DIAGNOSIS — R399 Unspecified symptoms and signs involving the genitourinary system: Secondary | ICD-10-CM | POA: Diagnosis not present

## 2023-05-17 DIAGNOSIS — N760 Acute vaginitis: Secondary | ICD-10-CM

## 2023-05-17 MED ORDER — FLUCONAZOLE 150 MG PO TABS: 150.0000 mg | ORAL_TABLET | Freq: Once | ORAL | 0 refills | Status: AC

## 2023-05-17 MED ORDER — CEPHALEXIN 500 MG PO CAPS: 500.0000 mg | ORAL_CAPSULE | Freq: Two times a day (BID) | ORAL | 0 refills | Status: AC

## 2023-05-17 NOTE — Progress Notes (Signed)

## 2023-05-17 NOTE — Progress Notes (Signed)
I have spent 5 minutes in review of e-visit questionnaire, review and updating patient chart, medical decision making and response to patient.  ° °Zelda W Fleming, NP ° °  °
